# Patient Record
Sex: Male | Born: 1995 | Hispanic: Yes | Marital: Married | Smoking: Current every day smoker
Health system: Southern US, Community
[De-identification: ages and names within clinical notes are randomized; demographics above are authoritative.]

## PROBLEM LIST (undated history)

## (undated) DIAGNOSIS — F319 Bipolar disorder, unspecified: Secondary | ICD-10-CM

## (undated) DIAGNOSIS — F329 Major depressive disorder, single episode, unspecified: Secondary | ICD-10-CM

## (undated) DIAGNOSIS — J45909 Unspecified asthma, uncomplicated: Secondary | ICD-10-CM

## (undated) DIAGNOSIS — F32A Depression, unspecified: Secondary | ICD-10-CM

## (undated) HISTORY — PX: APPENDECTOMY: SHX54

---

## 2015-05-04 ENCOUNTER — Emergency Department (HOSPITAL_COMMUNITY)
Admission: EM | Admit: 2015-05-04 | Discharge: 2015-05-04 | Disposition: A | Discharge status: Other Acute Inpatient Hospital | Payer: Self-pay

## 2017-01-06 ENCOUNTER — Ambulatory Visit: Payer: Self-pay | Admitting: Medical

## 2018-12-23 ENCOUNTER — Encounter (HOSPITAL_COMMUNITY): Payer: Self-pay | Admitting: Obstetrics and Gynecology

## 2018-12-23 ENCOUNTER — Other Ambulatory Visit: Payer: Self-pay

## 2018-12-23 ENCOUNTER — Emergency Department (HOSPITAL_COMMUNITY): Payer: Self-pay

## 2018-12-23 ENCOUNTER — Emergency Department (HOSPITAL_COMMUNITY)
Admission: EM | Admit: 2018-12-23 | Discharge: 2018-12-23 | Disposition: A | Discharge status: Home / Self Care | Payer: Self-pay | Attending: Emergency Medicine | Admitting: Emergency Medicine

## 2018-12-23 DIAGNOSIS — J069 Acute upper respiratory infection, unspecified: Secondary | ICD-10-CM | POA: Insufficient documentation

## 2018-12-23 DIAGNOSIS — M940 Chondrocostal junction syndrome [Tietze]: Secondary | ICD-10-CM | POA: Insufficient documentation

## 2018-12-23 DIAGNOSIS — B9789 Other viral agents as the cause of diseases classified elsewhere: Secondary | ICD-10-CM | POA: Insufficient documentation

## 2018-12-23 DIAGNOSIS — F17200 Nicotine dependence, unspecified, uncomplicated: Secondary | ICD-10-CM | POA: Insufficient documentation

## 2018-12-23 DIAGNOSIS — J45909 Unspecified asthma, uncomplicated: Secondary | ICD-10-CM | POA: Insufficient documentation

## 2018-12-23 HISTORY — DX: Unspecified asthma, uncomplicated: J45.909

## 2018-12-23 HISTORY — DX: Bipolar disorder, unspecified: F31.9

## 2018-12-23 HISTORY — DX: Major depressive disorder, single episode, unspecified: F32.9

## 2018-12-23 HISTORY — DX: Depression, unspecified: F32.A

## 2018-12-23 LAB — COMPREHENSIVE METABOLIC PANEL
ALT: 30 U/L (ref 0–44)
ANION GAP: 7 (ref 5–15)
AST: 34 U/L (ref 15–41)
Albumin: 4.7 g/dL (ref 3.5–5.0)
Alkaline Phosphatase: 76 U/L (ref 38–126)
BUN: 6 mg/dL (ref 6–20)
CO2: 24 mmol/L (ref 22–32)
Calcium: 9.4 mg/dL (ref 8.9–10.3)
Chloride: 108 mmol/L (ref 98–111)
Creatinine, Ser: 0.75 mg/dL (ref 0.61–1.24)
GFR calc Af Amer: >60 mL/min (ref >60)
GFR calc non Af Amer: >60 mL/min (ref >60)
Glucose, Bld: 101 mg/dL — ABNORMAL HIGH (ref 70–99)
POTASSIUM: 3.5 mmol/L (ref 3.5–5.1)
Sodium: 139 mmol/L (ref 135–145)
Total Bilirubin: 0.9 mg/dL (ref 0.3–1.2)
Total Protein: 8 g/dL (ref 6.5–8.1)

## 2018-12-23 LAB — CBC WITH DIFFERENTIAL/PLATELET
Abs Immature Granulocytes: 0.03 10*3/uL (ref 0.00–0.07)
BASOS ABS: 0 10*3/uL (ref 0.0–0.1)
Basophils Relative: 0 %
Eosinophils Absolute: 0.3 10*3/uL (ref 0.0–0.5)
Eosinophils Relative: 2 %
HCT: 44.7 % (ref 39.0–52.0)
HEMOGLOBIN: 15 g/dL (ref 13.0–17.0)
Immature Granulocytes: 0 %
LYMPHS PCT: 18 %
Lymphs Abs: 2.2 10*3/uL (ref 0.7–4.0)
MCH: 31.2 pg (ref 26.0–34.0)
MCHC: 33.6 g/dL (ref 30.0–36.0)
MCV: 92.9 fL (ref 80.0–100.0)
Monocytes Absolute: 1.1 10*3/uL — ABNORMAL HIGH (ref 0.1–1.0)
Monocytes Relative: 9 %
NEUTROS ABS: 8.6 10*3/uL — AB (ref 1.7–7.7)
Neutrophils Relative %: 71 %
Platelets: 277 10*3/uL (ref 150–400)
RBC: 4.81 MIL/uL (ref 4.22–5.81)
RDW: 13.8 % (ref 11.5–15.5)
WBC: 12.2 10*3/uL — ABNORMAL HIGH (ref 4.0–10.5)
nRBC: 0 % (ref 0.0–0.2)

## 2018-12-23 LAB — TROPONIN I: Troponin I: <0.03 ng/mL (ref <0.03)

## 2018-12-23 MED ORDER — KETOROLAC TROMETHAMINE 30 MG/ML IJ SOLN
30.0000 mg | Freq: Once | INTRAMUSCULAR | Status: DC
  Filled 2018-12-23: qty 1

## 2018-12-23 MED ORDER — KETOROLAC TROMETHAMINE 60 MG/2ML IM SOLN
30.0000 mg | Freq: Once | INTRAMUSCULAR | Status: AC
  Administered 2018-12-23: 30 mg via INTRAMUSCULAR

## 2018-12-23 MED ORDER — IBUPROFEN 800 MG PO TABS: 800.0000 mg | ORAL_TABLET | Freq: Three times a day (TID) | ORAL | 0 refills | Status: AC | PRN

## 2018-12-23 MED ORDER — PROMETHAZINE-DM 6.25-15 MG/5ML PO SYRP: 5.0000 mL | ORAL_SOLUTION | Freq: Four times a day (QID) | ORAL | 0 refills | Status: AC | PRN

## 2018-12-23 NOTE — ED Provider Notes (Signed)
[facility]-EMERGENCY DEPT Provider Note   CSN: [id number] Arrival date & time: 12/23/18  7829    History   Chief Complaint Chief Complaint  Patient presents with  . Cough  . Shortness of Breath    HPI Alexander Hobbs is a 23 y.o. male.     23 year old male with past medical history including bipolar disorder, asthma who presents with cough.  1 week ago, he began having cough that was initially associated with diarrhea but diarrhea has resolved.  He has continued to have cough and over the past 2 to 3 days has had worsening intermittent pain on his right lower chest wall wrapping around to his right side.  Pain is severe when he has coughing fits.  He has had one episode of posttussive emesis.  No fevers, sore throat, runny nose, abdominal pain, or shortness of breath.  No recent travel, sick contacts, history of cancer, or history of blood clots.  He has tried over-the-counter cough remedies and Tylenol with no significant relief.  Last dose of Tylenol was last night. OF note, he did have some amoxicillin at home, has taken 1 tablet daily for the past 5 days.  The history is provided by the patient.  Cough  Associated symptoms: shortness of breath   Shortness of Breath  Associated symptoms: cough     Past Medical History:  Diagnosis Date  . Asthma   . Bipolar 1 disorder (HCC)   . Depression     There are no active problems to display for this patient.   ** The histories are not reviewed yet. Please review them in the "History" navigator section and refresh this SmartLink.      Home Medications    Prior to Admission medications   Medication Sig Start Date End Date Taking? Authorizing Provider  acetaminophen (TYLENOL) 500 MG tablet Take 500 mg by mouth every 6 (six) hours as needed for mild pain or headache.   Yes [provider]  Camphor-Eucalyptus-Menthol (VICKS VAPORUB) 4.7-1.2-2.6 % OINT Apply 1 application topically as needed  (congestion).   Yes [provider]  ibuprofen (ADVIL,MOTRIN) 800 MG tablet Take 1 tablet (800 mg total) by mouth every 8 (eight) hours as needed for moderate pain. 12/23/18   , Ambrose Finland, MD  promethazine-dextromethorphan (PROMETHAZINE-DM) 6.25-15 MG/5ML syrup Take 5 mLs by mouth 4 (four) times daily as needed for up to 4 days for cough. 12/23/18 12/27/18  , Ambrose Finland, MD    Family History No family history on file.  Social History Social History   Tobacco Use  . Smoking status: Current Every Day Smoker    Packs/day: 0.10    Years: 6.00    Pack years: 0.60  . Smokeless tobacco: Never Used  Substance Use Topics  . Alcohol use: Yes    Comment: Socially  . Drug use: Yes    Types: Marijuana     Allergies   Patient has no allergy information on record.   Review of Systems Review of Systems  Respiratory: Positive for cough and shortness of breath.    All other systems reviewed and are negative except that which was mentioned in HPI   Physical Exam Updated Vital Signs BP 117/67   Pulse 62   Temp 98.4 F (36.9 C) (Oral)   Resp 13   Ht 5\' 7"  (1.702 m)   Wt 72.6 kg   SpO2 98%   BMI 25.06 kg/m   Physical Exam Vitals signs and nursing note reviewed.  Constitutional:      General: He is not in acute distress.    Appearance: He is well-developed.     Comments: Holding R side/chest wall, uncomfortable  HENT:     Head: Normocephalic and atraumatic.     Mouth/Throat:     Mouth: Mucous membranes are moist.     Pharynx: Oropharynx is clear. No oropharyngeal exudate.  Eyes:     Conjunctiva/sclera: Conjunctivae normal.  Neck:     Musculoskeletal: Neck supple.  Cardiovascular:     Rate and Rhythm: Normal rate and regular rhythm.     Heart sounds: Normal heart sounds. No murmur.  Pulmonary:     Effort: Pulmonary effort is normal.     Breath sounds: Normal breath sounds. No wheezing or rales.     Comments: Frequent cough Abdominal:      General: Bowel sounds are normal. There is no distension.     Palpations: Abdomen is soft.     Tenderness: There is no abdominal tenderness.  Skin:    General: Skin is warm and dry.  Neurological:     Mental Status: He is alert and oriented to person, place, and time.     Comments: Fluent speech  Psychiatric:        Judgment: Judgment normal.      ED Treatments / Results  Labs (all labs ordered are listed, but only abnormal results are displayed) Labs Reviewed  COMPREHENSIVE METABOLIC PANEL - Abnormal; Notable for the following components:      Result Value   Glucose, Bld 101 (*)    All other components within normal limits  CBC WITH DIFFERENTIAL/PLATELET - Abnormal; Notable for the following components:   WBC 12.2 (*)    Neutro Abs 8.6 (*)    Monocytes Absolute 1.1 (*)    All other components within normal limits  TROPONIN I    EKG EKG Interpretation  Date/Time:  Wednesday December 23 2018 12:09:40 EDT Ventricular Rate:  63 PR Interval:    QRS Duration: 77 QT Interval:  378 QTC Calculation: 387 R Axis:   78 Text Interpretation:  Sinus rhythm EKG WITHIN NORMAL LIMITS Confirmed by Frederick Peers [phone]) on 12/23/2018 12:12:28 PM Also confirmed by Frederick Peers [phone]), editor Barbette Hair [phone])  on 12/23/2018 1:08:33 PM   Radiology Dg Chest Port 1 View  Result Date: 12/23/2018 CLINICAL DATA:  Cough and chest pain EXAM: PORTABLE CHEST 1 VIEW COMPARISON:  None. FINDINGS: Lungs are clear. Heart size and pulmonary vascularity are normal. No adenopathy. No pneumothorax. No bone lesions. IMPRESSION: No edema or consolidation. Electronically Signed   By: Bretta Bang III M.D.   On: 12/23/2018 10:27    Procedures Procedures (including critical care time)  Medications Ordered in ED Medications  ketorolac (TORADOL) injection 30 mg (30 mg Intramuscular Given 12/23/18 1031)     Initial Impression / Assessment and Plan / ED Course  I have reviewed the triage vital  signs and the nursing notes.  Pertinent labs & imaging results that were available during my care of the patient were reviewed by me and considered in my medical decision making (see chart for details).        Non-toxic on exam, normal vital signs, normal WOB, no wheezing. No identifiable risk factors for COVID19. PERC negative therefore PE very unlikely.  Lab work shows normal CMP, WBC 12.2, negative trop, normal CXR. He has no risk factors for PE and his VS are reassuring against PE.  Symptoms are highly suggestive of  costochondritis from all of his coughing.  I have discussed supportive measures including cough medications and NSAIDs.  Recommended self quarantining until all of his symptoms have resolved.  I have extensively reviewed return precautions with him and he voiced understanding.  Patient well-appearing and improved at time of discharge  Final Clinical Impressions(s) / ED Diagnoses   Final diagnoses:  Viral URI with cough  Costochondritis    ED Discharge Orders         Ordered    promethazine-dextromethorphan (PROMETHAZINE-DM) 6.25-15 MG/5ML syrup  4 times daily PRN     12/23/18 1220    ibuprofen (ADVIL,MOTRIN) 800 MG tablet  Every 8 hours PRN     12/23/18 1220           , Ambrose Finland, MD 12/25/18 0011

## 2018-12-23 NOTE — ED Notes (Signed)
EDP LITTLE OK WITH DROPLET PRECAUTIONS ONLY.

## 2018-12-23 NOTE — ED Triage Notes (Signed)
Pt reports he has had a cough with green sputum and it is worse in the morning. Pt reports possible blood mixed with mucus. Pt denies recently being out of the country or around anyone who is ill. 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 Pt reports he has had these symptoms for about a week and has been feeling more short of breath.  Pt reports some episodes of diarrhea. Pt denies nausea/emesis. Pt denies fever

## 2018-12-23 NOTE — ED Notes (Signed)
ED Provider at bedside. LITTLE 

## 2018-12-23 NOTE — ED Notes (Signed)
DENIES SOB WHILE WALKING TO BATHROOM

## 2018-12-23 NOTE — ED Notes (Signed)
XRAY AT BEDSIDE

## 2018-12-23 NOTE — ED Notes (Signed)
RX X 2 GIVEN 

## 2020-10-22 IMAGING — DX PORTABLE CHEST - 1 VIEW
1 series · 1 of 1 positions shown · non-contrast
Comparison: None.

CLINICAL DATA: Cough and chest pain

EXAM:
PORTABLE CHEST 1 VIEW

[chest ap]
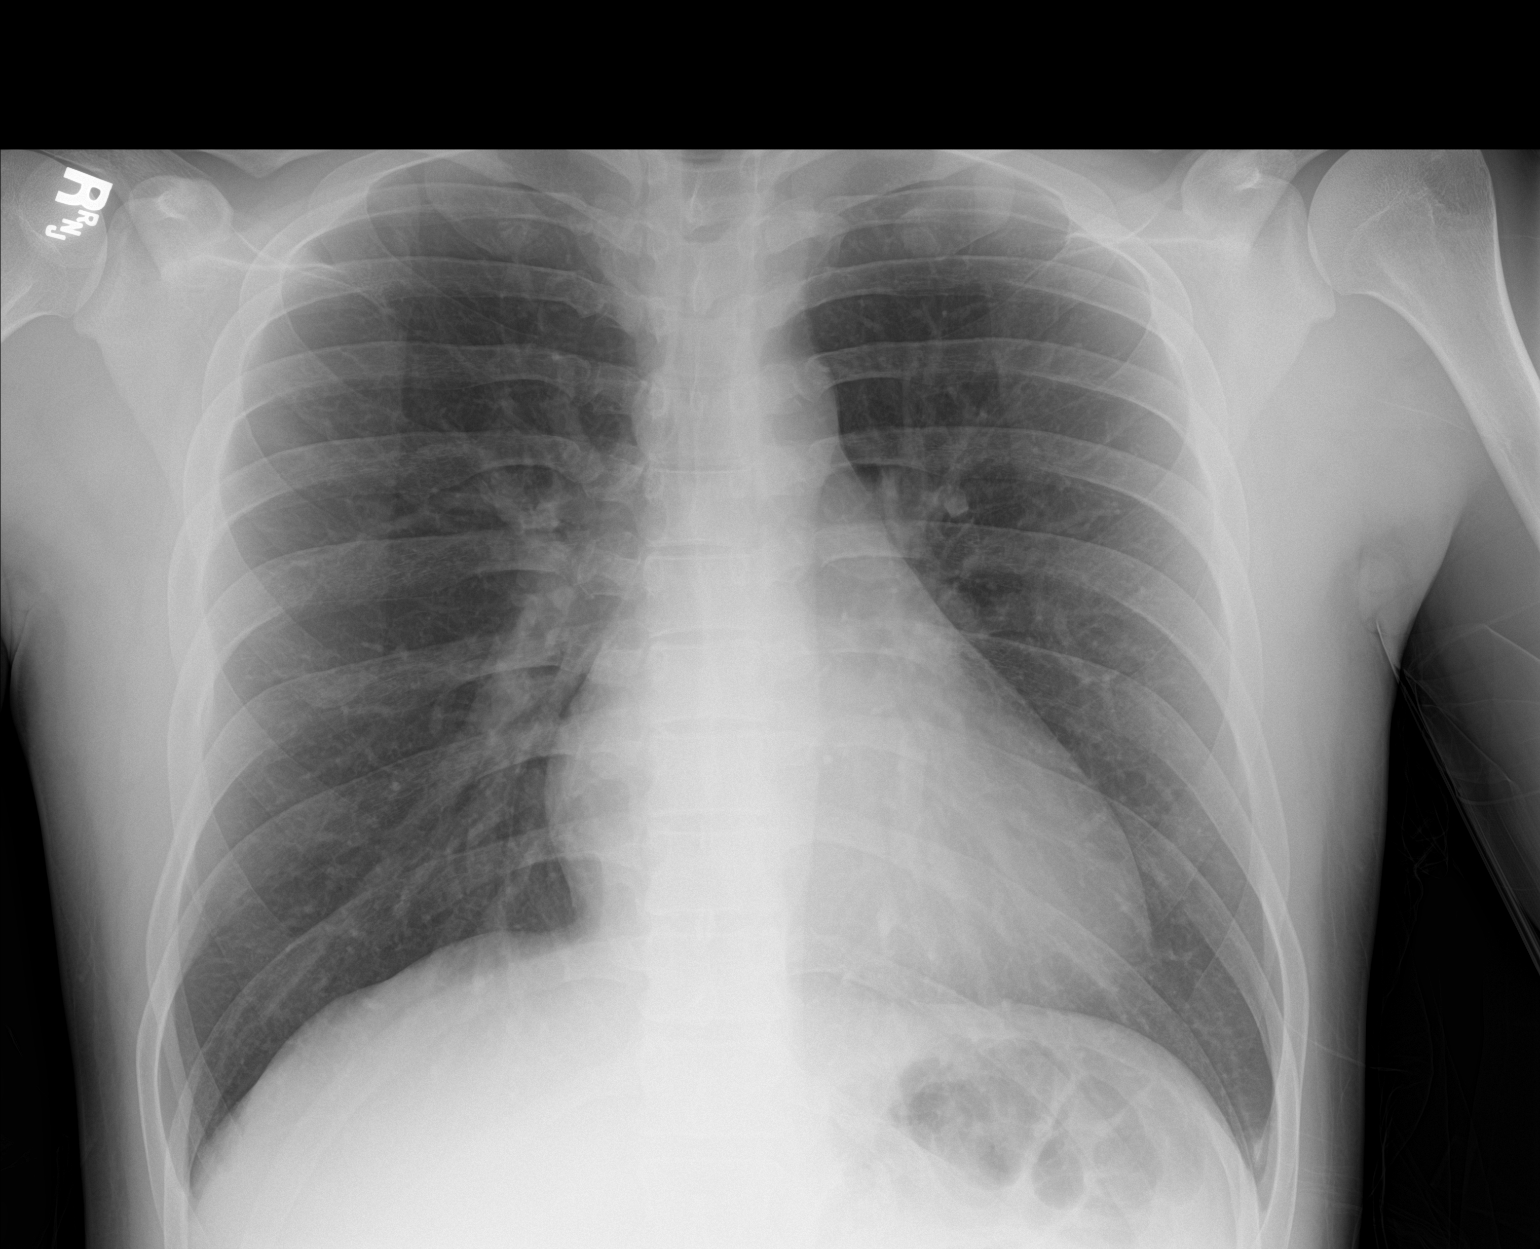

[1 of 1 positions shown; findings below may reference images not displayed]

FINDINGS: Lungs are clear. Heart size and pulmonary vascularity are normal. No
adenopathy. No pneumothorax. No bone lesions.
IMPRESSION: No edema or consolidation.
